# Patient Record
Sex: Male | Born: 1990 | Hispanic: Yes | Marital: Single
Health system: Southern US, Community
[De-identification: ages and names within clinical notes are randomized; demographics above are authoritative.]

---

## 2016-12-02 ENCOUNTER — Emergency Department (HOSPITAL_COMMUNITY)
Admission: EM | Admit: 2016-12-02 | Discharge: 2016-12-03 | Disposition: A | Discharge status: Home / Self Care | Payer: Self-pay | Attending: Emergency Medicine | Admitting: Emergency Medicine

## 2016-12-02 DIAGNOSIS — Y998 Other external cause status: Secondary | ICD-10-CM | POA: Insufficient documentation

## 2016-12-02 DIAGNOSIS — W19XXXA Unspecified fall, initial encounter: Secondary | ICD-10-CM

## 2016-12-02 DIAGNOSIS — Y929 Unspecified place or not applicable: Secondary | ICD-10-CM | POA: Insufficient documentation

## 2016-12-02 DIAGNOSIS — Z91010 Allergy to peanuts: Secondary | ICD-10-CM | POA: Insufficient documentation

## 2016-12-02 DIAGNOSIS — W1789XA Other fall from one level to another, initial encounter: Secondary | ICD-10-CM | POA: Insufficient documentation

## 2016-12-02 DIAGNOSIS — Y9389 Activity, other specified: Secondary | ICD-10-CM | POA: Insufficient documentation

## 2016-12-02 DIAGNOSIS — S0990XA Unspecified injury of head, initial encounter: Secondary | ICD-10-CM

## 2016-12-02 MED ORDER — IBUPROFEN 400 MG PO TABS
ORAL_TABLET | ORAL | Status: AC
  Filled 2016-12-02: qty 1

## 2016-12-02 MED ORDER — IBUPROFEN 400 MG PO TABS
400.0000 mg | ORAL_TABLET | Freq: Once | ORAL | Status: AC | PRN
  Administered 2016-12-02: 400 mg via ORAL

## 2016-12-02 NOTE — ED Provider Notes (Signed)
[facility]-EMERGENCY DEPT Provider Note   CSN: [id number] Arrival date & time: 12/02/16  1914     History   Chief Complaint Chief Complaint  Patient presents with  . Fall    from a box truck    HPI Alexander Bowers is a 26 y.o. male.  HPI   Alexander Bowers is a 26 y.o. male, patient with no pertinent past medical history, presenting to the ED with injuries from a fall that occurred about 5:30 PM 8/2. States he fell backwards off a box truck ramp estimated to be about 5 ft high. Hit the back of the head. Approx 15 sec LOC. Patient complains of pain to the back of the head, left neck and left upper back. Pain in the neck and the back are 8 out of 10, but only with movement or palpation. Otherwise, the patient is pain-free in these areas. Head pain is mild. Has not taken any medications for his symptoms prior to arrival.  Denies dizziness, N/V, neuro deficits, SOB, CP, changes in bowel or bladder function, or any other complaints.     No past medical history on file.  There are no active problems to display for this patient.   No past surgical history on file.     Home Medications    Prior to Admission medications   Medication Sig Start Date End Date Taking? Authorizing Provider  ibuprofen (ADVIL,MOTRIN) 600 MG tablet Take 1 tablet (600 mg total) by mouth every 6 (six) hours as needed. 12/03/16   Joy, Shawn C, PA-C  lidocaine (LIDODERM) 5 % Place 1 patch onto the skin daily. Remove & Discard patch within 12 hours or as directed by MD 12/03/16   Joy, Shawn C, PA-C  methocarbamol (ROBAXIN) 500 MG tablet Take 1 tablet (500 mg total) by mouth 2 (two) times daily. 12/03/16   Joy, Hillard DankerShawn C, PA-C    Family History No family history on file.  Social History Social History  Substance Use Topics  . Smoking status: Not on file  . Smokeless tobacco: Not on file  . Alcohol use Not on file     Allergies   Peanut-containing drug products and Vanilla   Review of Systems Review of  Systems  Respiratory: Negative for shortness of breath.   Cardiovascular: Negative for chest pain.  Gastrointestinal: Negative for abdominal pain, nausea and vomiting.  Musculoskeletal: Positive for back pain and neck pain.  Neurological: Negative for dizziness, weakness, light-headedness, numbness and headaches.       Approximately 15 seconds of LOC.  All other systems reviewed and are negative.    Physical Exam Updated Vital Signs BP (!) 143/47 (BP Location: Left Arm)   Pulse 90   Temp 98.5 F (36.9 C) (Oral)   Resp 16   Ht 5\' 5"  (1.651 m)   Wt 74.8 kg (165 lb)   SpO2 99%   BMI 27.46 kg/m   Physical Exam  Constitutional: He is oriented to person, place, and time. He appears well-developed and well-nourished. No distress.  HENT:  Head: Normocephalic. Head is without raccoon's eyes and without Battle's sign.  Mouth/Throat: Oropharynx is clear and moist.  Abrasion to central occipital scalp without swelling, hemorrhage, instability, or hematoma noted. No hemotympanum.  Eyes: Pupils are equal, round, and reactive to light. Conjunctivae and EOM are normal.  Neck: Normal range of motion. Neck supple.  Cardiovascular: Normal rate, regular rhythm, normal heart sounds and intact distal pulses.   Pulmonary/Chest: Effort normal and breath sounds normal. No  respiratory distress. He exhibits no tenderness.  Abrasions to left posterior chest.   Abdominal: Soft. There is no tenderness. There is no guarding.  Musculoskeletal: He exhibits tenderness. He exhibits no edema.  Tenderness to midline cervical and thoracic spine without noted step-off, instability, deformity, or swelling. No tenderness noted to the lumbar spine. C-collar in place.  Full passive and active range of motion in the patient's bilateral shoulders, elbows, and wrists as well as the hips, knees, and ankles.  Neurological: He is alert and oriented to person, place, and time.  No sensory deficits. Strength 5/5 in all  extremities. No gait disturbance. Coordination intact including heel to shin and finger to nose. Cranial nerves III-XII grossly intact. No facial droop.   Skin: Skin is warm and dry. Capillary refill takes less than 2 seconds. He is not diaphoretic.  Psychiatric: He has a normal mood and affect. His behavior is normal.  Nursing note and vitals reviewed.    ED Treatments / Results  Labs (all labs ordered are listed, but only abnormal results are displayed) Labs Reviewed - No data to display  EKG  EKG Interpretation None       Radiology Dg Thoracic Spine 2 View  Result Date: 12/03/2016 CLINICAL DATA:  26 year old male with fall and back pain. EXAM: THORACIC SPINE 2 VIEWS; LUMBAR SPINE - COMPLETE 4+ VIEW COMPARISON:  None FINDINGS: No definite acute thoracic or lumbar spine fracture or subluxation. Apparent central depression of the superior endplate of L5 (seen only on the lateral view of the sacrum) is felt to be artifactual. Correlation with point tenderness recommended. If there is high clinical concern for an acute fracture at the level of L5 CT may provide better evaluation. The visualized transverse and spinous processes appear intact. The soft tissues are grossly unremarkable. IMPRESSION: No definite acute/ traumatic thoracic or lumbar spine pathology. Apparent central depression of the superior endplate of L5 likely artifactual. Clinical correlation is recommended. Electronically Signed   By: Elgie Collard M.D.   On: 12/03/2016 01:02   Dg Lumbar Spine Complete  Result Date: 12/03/2016 CLINICAL DATA:  26 year old male with fall and back pain. EXAM: THORACIC SPINE 2 VIEWS; LUMBAR SPINE - COMPLETE 4+ VIEW COMPARISON:  None FINDINGS: No definite acute thoracic or lumbar spine fracture or subluxation. Apparent central depression of the superior endplate of L5 (seen only on the lateral view of the sacrum) is felt to be artifactual. Correlation with point tenderness recommended. If there is  high clinical concern for an acute fracture at the level of L5 CT may provide better evaluation. The visualized transverse and spinous processes appear intact. The soft tissues are grossly unremarkable. IMPRESSION: No definite acute/ traumatic thoracic or lumbar spine pathology. Apparent central depression of the superior endplate of L5 likely artifactual. Clinical correlation is recommended. Electronically Signed   By: Elgie Collard M.D.   On: 12/03/2016 01:02   Ct Head Wo Contrast  Result Date: 12/03/2016 CLINICAL DATA:  Patient fell off of a truck at 5 p.m. Left-sided pain. EXAM: CT HEAD WITHOUT CONTRAST CT CERVICAL SPINE WITHOUT CONTRAST TECHNIQUE: Multidetector CT imaging of the head and cervical spine was performed following the standard protocol without intravenous contrast. Multiplanar CT image reconstructions of the cervical spine were also generated. COMPARISON:  None. FINDINGS: CT HEAD FINDINGS Brain: No evidence of acute infarction, hemorrhage, hydrocephalus, extra-axial collection or mass lesion/mass effect. Vascular: No hyperdense vessel or unexpected calcification. Skull: No depressed skull fractures. Sinuses/Orbits: No acute finding. Other: None. CT CERVICAL  SPINE FINDINGS Alignment: Normal alignment of the cervical spine. Skull base and vertebrae: Skull base appears intact. Mastoid air cells are not opacified. No vertebral compression deformities. Intervertebral disc space heights are preserved. No focal bone lesion or bone destruction. C1-2 articulation appears intact. Soft tissues and spinal canal: No prevertebral soft tissue swelling. No soft tissue infiltration or edema. Disc levels:  Intervertebral disc space heights are preserved. Upper chest: Motion artifact limits evaluation of the lung apices. No gross abnormality identified. Other: None. IMPRESSION: 1. No acute intracranial abnormalities. 2. Normal alignment of the cervical spine. No acute displaced fractures identified.  Electronically Signed   By: Burman NievesWilliam  Stevens M.D.   On: 12/03/2016 00:57   Ct Cervical Spine Wo Contrast  Result Date: 12/03/2016 CLINICAL DATA:  Patient fell off of a truck at 5 p.m. Left-sided pain. EXAM: CT HEAD WITHOUT CONTRAST CT CERVICAL SPINE WITHOUT CONTRAST TECHNIQUE: Multidetector CT imaging of the head and cervical spine was performed following the standard protocol without intravenous contrast. Multiplanar CT image reconstructions of the cervical spine were also generated. COMPARISON:  None. FINDINGS: CT HEAD FINDINGS Brain: No evidence of acute infarction, hemorrhage, hydrocephalus, extra-axial collection or mass lesion/mass effect. Vascular: No hyperdense vessel or unexpected calcification. Skull: No depressed skull fractures. Sinuses/Orbits: No acute finding. Other: None. CT CERVICAL SPINE FINDINGS Alignment: Normal alignment of the cervical spine. Skull base and vertebrae: Skull base appears intact. Mastoid air cells are not opacified. No vertebral compression deformities. Intervertebral disc space heights are preserved. No focal bone lesion or bone destruction. C1-2 articulation appears intact. Soft tissues and spinal canal: No prevertebral soft tissue swelling. No soft tissue infiltration or edema. Disc levels:  Intervertebral disc space heights are preserved. Upper chest: Motion artifact limits evaluation of the lung apices. No gross abnormality identified. Other: None. IMPRESSION: 1. No acute intracranial abnormalities. 2. Normal alignment of the cervical spine. No acute displaced fractures identified. Electronically Signed   By: Burman NievesWilliam  Stevens M.D.   On: 12/03/2016 00:57    Procedures Procedures (including critical care time)  Medications Ordered in ED Medications  ibuprofen (ADVIL,MOTRIN) 400 MG tablet (not administered)  ibuprofen (ADVIL,MOTRIN) tablet 400 mg (400 mg Oral Given 12/02/16 2131)     Initial Impression / Assessment and Plan / ED Course  I have reviewed the triage  vital signs and the nursing notes.  Pertinent labs & imaging results that were available during my care of the patient were reviewed by me and considered in my medical decision making (see chart for details).  Clinical Course as of Dec 04 531  Fri Dec 03, 2016  0200 Discussed imaging results with patient. Removed c-collar.   [SJ]  0205 Patient was reexamined. He has no noted tenderness anywhere along the lumbar spine or sacrum. DG Lumbar Spine Complete [SJ]    Clinical Course User Index [SJ] Joy, Shawn C, PA-C    Patient presents for evaluation following a fall. No acute abnormalities noted on imaging. Patient observed for an adequate amount of time in the ED without change in patient status. Follow up with concussion clinic as needed. The patient was given instructions for home care as well as return precautions. Patient voices understanding of these instructions, accepts the plan, and is comfortable with discharge.  Vitals:   12/03/16 0100 12/03/16 0115 12/03/16 0130 12/03/16 0145  BP: (!) 109/57 (!) 109/41 (!) 106/36 (!) 103/36  Pulse: 70 65 65 65  Resp:      Temp:      TempSrc:  SpO2: 100% 97% 97% 97%  Weight:      Height:          Final Clinical Impressions(s) / ED Diagnoses   Final diagnoses:  Fall, initial encounter  Injury of head, initial encounter    New Prescriptions Discharge Medication List as of 12/03/2016  2:16 AM    START taking these medications   Details  ibuprofen (ADVIL,MOTRIN) 600 MG tablet Take 1 tablet (600 mg total) by mouth every 6 (six) hours as needed., Starting Fri 12/03/2016, Print    lidocaine (LIDODERM) 5 % Place 1 patch onto the skin daily. Remove & Discard patch within 12 hours or as directed by MD, Starting Fri 12/03/2016, Print    methocarbamol (ROBAXIN) 500 MG tablet Take 1 tablet (500 mg total) by mouth 2 (two) times daily., Starting Fri 12/03/2016, Print         Joy, Shawn C, PA-C 12/03/16 0535    Ward, Layla Maw, DO 12/03/16  [phone]

## 2016-12-02 NOTE — ED Notes (Signed)
c collar applied on triage, no neuro deficit noticed.

## 2016-12-02 NOTE — ED Notes (Signed)
Pt c/o mild head pain.

## 2016-12-02 NOTE — ED Notes (Signed)
Dr. Patria Maneampos consulted about what orders to place on this pt after he fell, no others gotten from Dr. Patria Maneampos pt to be evaluated by ED provider on the back. Acuity of 2 given due to episode of LOC and pt report that he is having sudden sleepiness with headache and neck pain.

## 2016-12-02 NOTE — ED Triage Notes (Signed)
Pt states he fell out of box truck at work hit his head, per coworker have a short period of LOC, pt c/o 9/10 generalized pain, mostly upper lefts side shoulder, back, neck and head, pt states he feels very sleepy at this time.

## 2016-12-02 NOTE — ED Notes (Signed)
The pt fell backward and struck the back of his head approx 1700 today.  He reports that his friend told him he was knocked oout for 15 seconds.  He has pain in the top of his head and in his lt scapula area alert and oriented  Skin warm and dry  No distress

## 2016-12-03 ENCOUNTER — Emergency Department (HOSPITAL_COMMUNITY): Payer: Self-pay

## 2016-12-03 MED ORDER — METHOCARBAMOL 500 MG PO TABS: 500.0000 mg | ORAL_TABLET | Freq: Two times a day (BID) | ORAL | 0 refills | Status: AC

## 2016-12-03 MED ORDER — IBUPROFEN 600 MG PO TABS: 600.0000 mg | ORAL_TABLET | Freq: Four times a day (QID) | ORAL | 0 refills | Status: AC | PRN

## 2016-12-03 MED ORDER — LIDOCAINE 5 % EX PTCH: 1.0000 | MEDICATED_PATCH | CUTANEOUS | 0 refills | Status: AC

## 2016-12-03 NOTE — ED Notes (Addendum)
Pt returned from ct and xray.

## 2016-12-03 NOTE — Discharge Instructions (Signed)
There were no acute abnormalities on the imaging studies. Expect your soreness to increase over the next 2-3 days. Take it easy, but do not lay around too much as this may make any stiffness worse.  Antiinflammatory medications: Take 600 mg of ibuprofen every 6 hours or 440 mg (over the counter dose) to 500 mg (prescription dose) of naproxen every 12 hours or for the next 3 days. After this time, these medications may be used as needed for pain. Take these medications with food to avoid upset stomach. Choose only one of these medications, do not take them together.  Tylenol: Should you continue to have additional pain while taking the ibuprofen or naproxen, you may add in tylenol as needed. Your daily total maximum amount of tylenol from all sources should be limited to 4000mg /day for persons without liver problems, or 2000mg /day for those with liver problems. Muscle relaxer: Robaxin is a muscle relaxer and may help loosen stiff muscles. Do not take the Robaxin while driving or performing other dangerous activities.  Lidocaine patches: These are available via either prescription or over-the-counter. The over-the-counter option may be more economical one and are likely just as effective. There are multiple over-the-counter brands, such as Salonpas. Exercises: Be sure to perform the attached exercises starting with three times a week and working up to performing them daily. This is an essential part of preventing long term problems.   Follow up with a primary care provider for any future management of these complaints.  Head injury: You have been seen today for a head injury. It does not appear to be serious at this time.  Close observation: The close observation period is usually 6 hours from the injury. This includes staying awake and having a trustworthy adult monitor you to assure your condition does not worsen. You should be in regular contact with this person and ideally, they should be able to  monitor you in person.  Secondary observation: The secondary observation period is usually 24 hours from the injury. You are aloud to sleep during this time. A trustworthy adult should intermittently monitor you to assure your condition does not worsen.   Overall head injury/concussion care: Rest: Be sure to get plenty of rest. You will need more rest and sleep while you recover. Hydration: Be sure to stay well hydrated by having a goal of drinking about 0.5 liters of water an hour. Pain:  Antiinflammatory medications: Take 600 mg of ibuprofen every 6 hours or 440 mg (over the counter dose) to 500 mg (prescription dose) of naproxen every 12 hours or for the next 3 days. After this time, these medications may be used as needed for pain. Take these medications with food to avoid upset stomach. Choose only one of these medications, do not take them together. Tylenol: Should you continue to have additional pain while taking the ibuprofen or naproxen, you may add in tylenol as needed. Your daily total maximum amount of tylenol from all sources should be limited to 4000mg /day for persons without liver problems, or 2000mg /day for those with liver problems. Return to sports and activities: In general, you may return to normal activities once symptoms have subsided, however, you would ideally be cleared by a primary care provider or other qualified medical professional prior to return to these activities.  Follow up: Follow up with the concussion clinic or your primary care provider for further management of this issue. Return: Return to the ED should any symptoms worsen.

## 2018-12-17 IMAGING — CT CT HEAD W/O CM
4 of 8 series · 16 of 47 positions shown, 18 images · non-contrast
Comparison: None.

CLINICAL DATA: Patient fell off of a truck at 5 p.m.. Left-sided
pain.

EXAM:
CT HEAD WITHOUT CONTRAST
CT CERVICAL SPINE WITHOUT CONTRAST
TECHNIQUE: Multidetector CT imaging of the head and cervical spine was
performed following the standard protocol without intravenous
contrast. Multiplanar CT image reconstructions of the cervical spine
were also generated.

[Series 4: head bone · axial · 0.43mm/px · z∈[-156,-80]mm · 4 of 90 slices shown]
[im 13/90  bone]
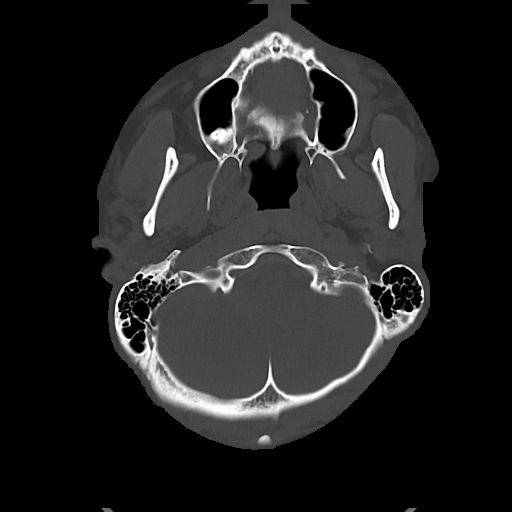
[im 26/90  bone]
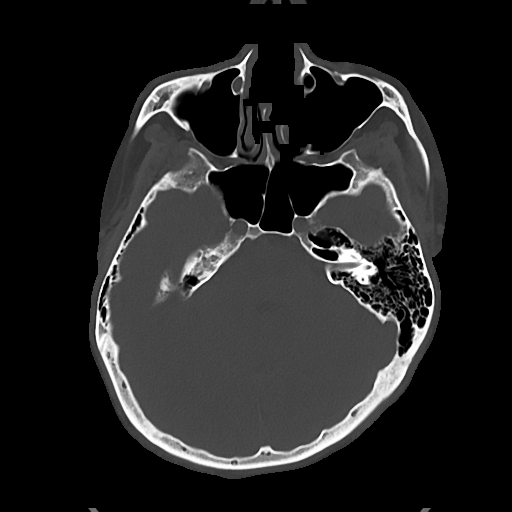
[im 39/90  bone]
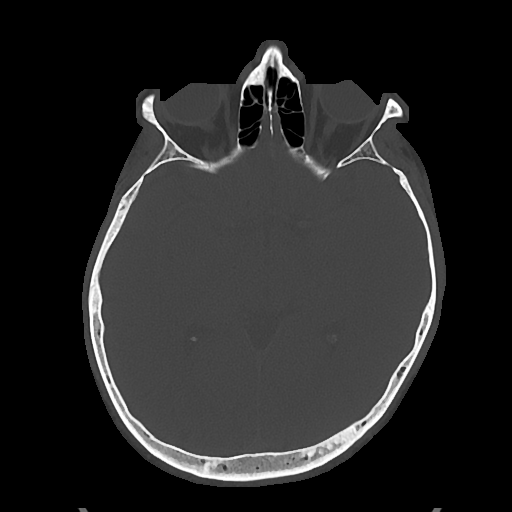
[im 51/90  bone]
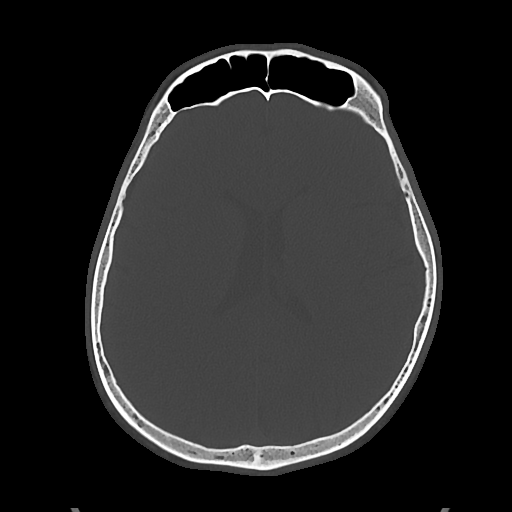

[Series 5: cor soft · coronal · 0.38mm/px · 3 of 75 slices shown]
[im 19/75  brain]
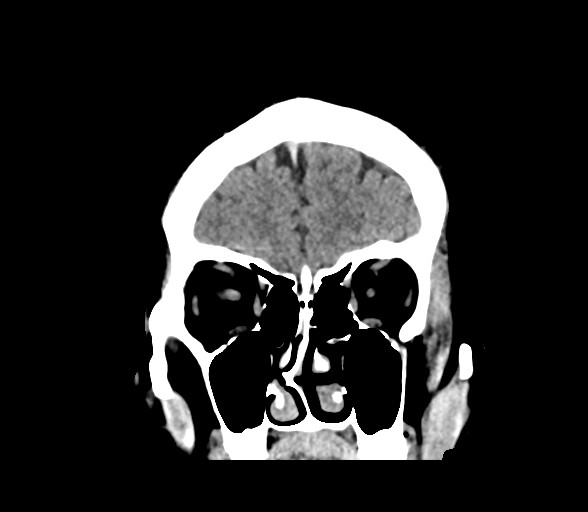
[im 38/75  brain]
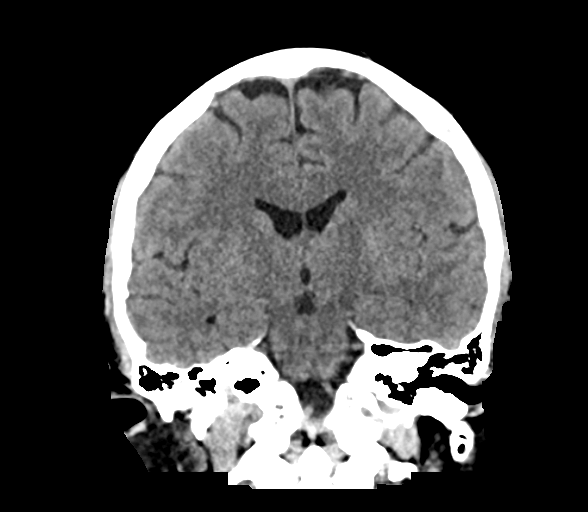
[im 56/75  brain]
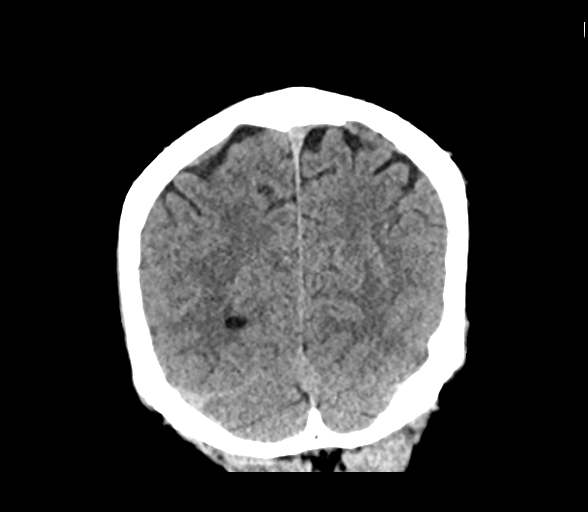

[Series 6: sag soft · sagittal · 0.35mm/px · 2 of 65 slices shown]
[im 22/65  brain]
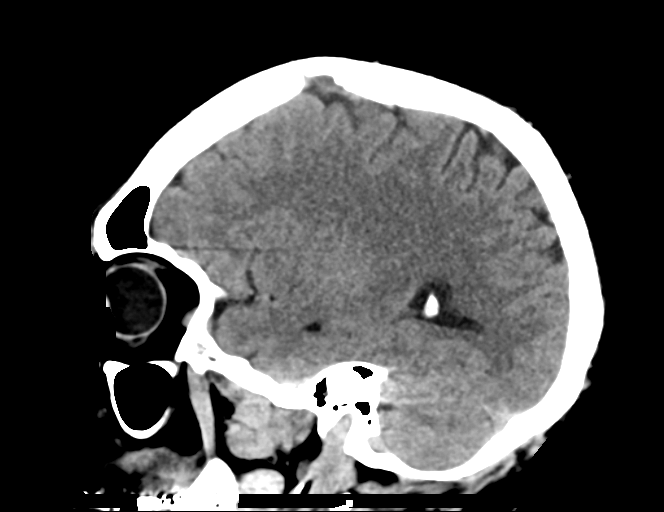
[im 43/65  brain]
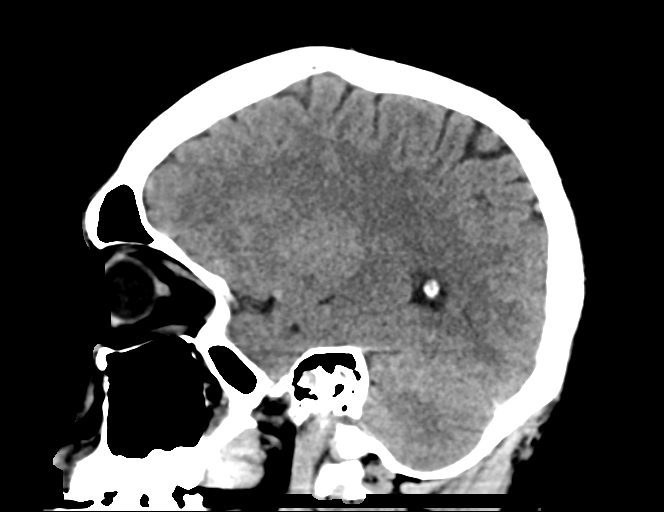

[Series 12: orthogonal axials · axial · 0.21mm/px · z∈[-330,-180]mm · 7 of 108 slices shown, 9 images]
[im 14/108  brain]
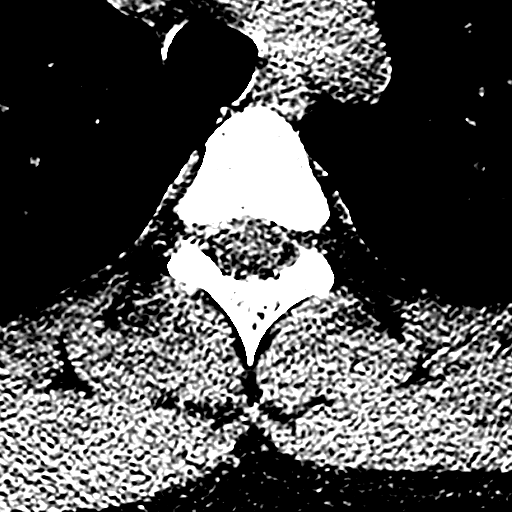
[im 14/108  bone]
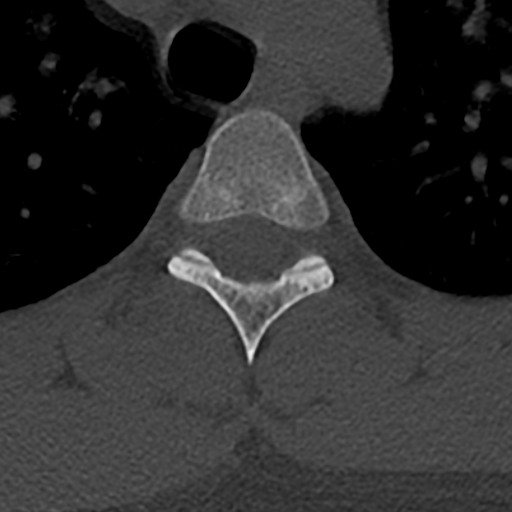
[im 27/108  brain]
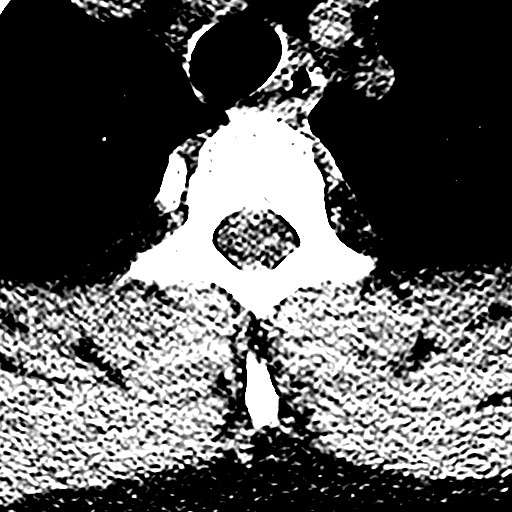
[im 41/108  brain]
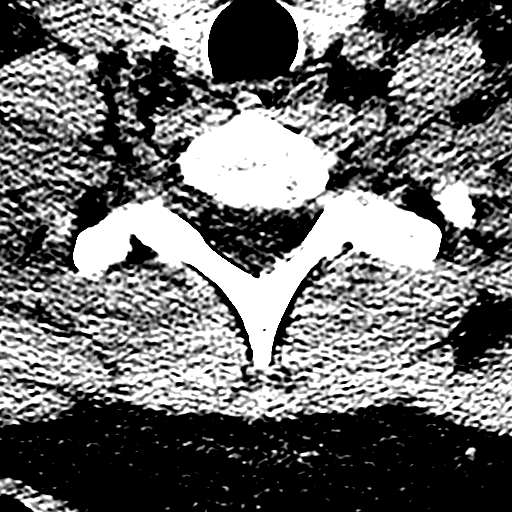
[im 54/108  brain]
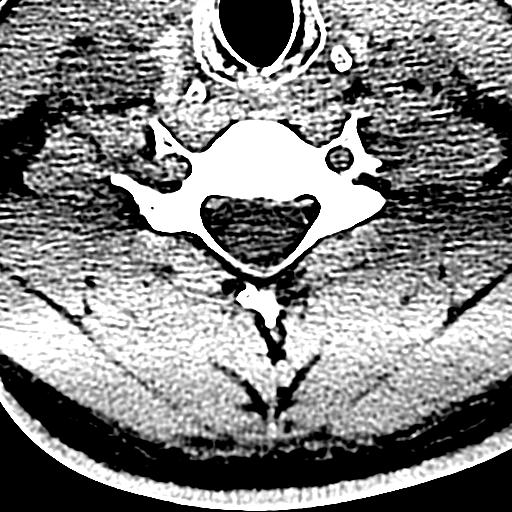
[im 67/108  brain]
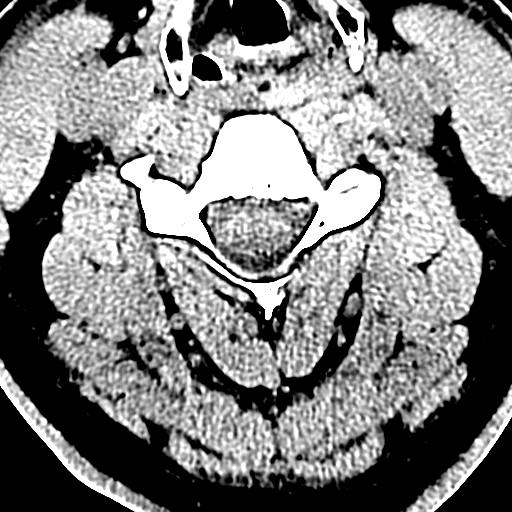
[im 67/108  bone]
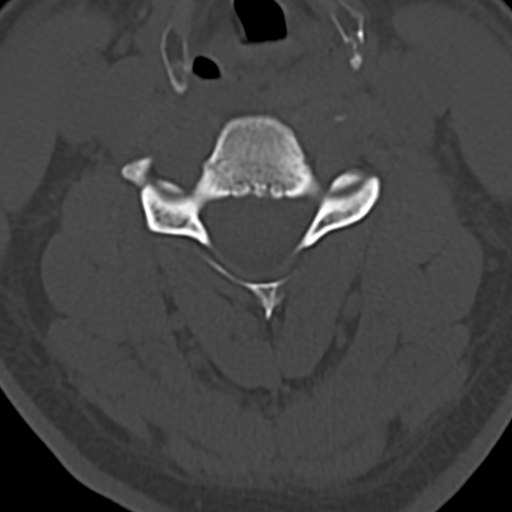
[im 81/108  brain]
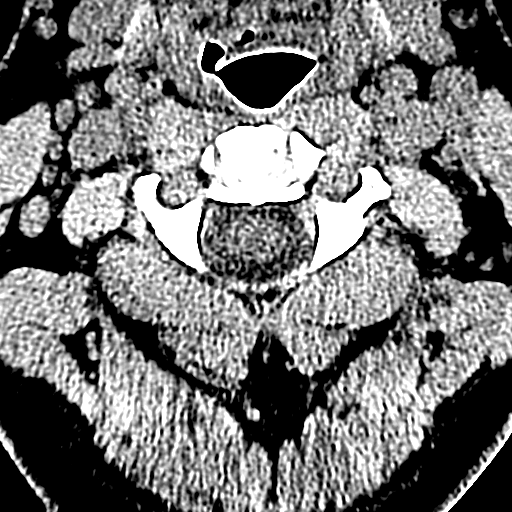
[im 94/108  brain]
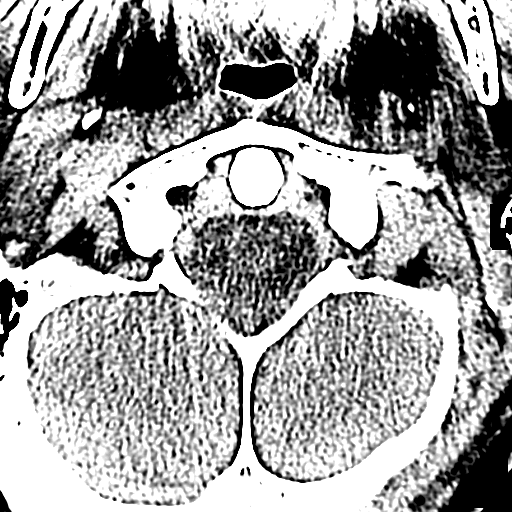

[16 of 47 positions shown; findings below may reference images not displayed]

FINDINGS: CT HEAD FINDINGS

Brain: No evidence of acute infarction, hemorrhage, hydrocephalus,
extra-axial collection or mass lesion/mass effect.

Vascular: No hyperdense vessel or unexpected calcification.

Skull: No depressed skull fractures.

Sinuses/Orbits: No acute finding.

Other: None.

CT CERVICAL SPINE FINDINGS

Alignment: Normal alignment of the cervical spine.

Skull base and vertebrae: Skull base appears intact. Mastoid air
cells are not opacified. No vertebral compression deformities.
Intervertebral disc space heights are preserved. No focal bone
lesion or bone destruction. C1-2 articulation appears intact.

Soft tissues and spinal canal: No prevertebral soft tissue swelling.
No soft tissue infiltration or edema.

Disc levels:  Intervertebral disc space heights are preserved.

Upper chest: Motion artifact limits evaluation of the lung apices.
No gross abnormality identified.

Other: None.
IMPRESSION: 1. No acute intracranial abnormalities.
2. Normal alignment of the cervical spine. No acute displaced
fractures identified.
